# Patient Record
Sex: Female | Born: 1970 | Race: Black or African American | Hispanic: No | Marital: Married | Smoking: Never smoker
Health system: Southern US, Community
[De-identification: ages and names within clinical notes are randomized; demographics above are authoritative.]

## PROBLEM LIST (undated history)

## (undated) DIAGNOSIS — I10 Essential (primary) hypertension: Secondary | ICD-10-CM

## (undated) HISTORY — DX: Essential (primary) hypertension: I10

---

## 2003-12-04 ENCOUNTER — Other Ambulatory Visit: Admission: RE | Admit: 2003-12-04 | Discharge: 2003-12-04 | Payer: Self-pay | Admitting: Obstetrics and Gynecology

## 2004-08-01 ENCOUNTER — Encounter: Admission: RE | Admit: 2004-08-01 | Discharge: 2004-08-01 | Payer: Self-pay | Admitting: Obstetrics and Gynecology

## 2004-08-02 ENCOUNTER — Inpatient Hospital Stay (HOSPITAL_COMMUNITY): Admission: AD | Admit: 2004-08-02 | Discharge: 2004-08-04 | Payer: Self-pay | Admitting: Obstetrics and Gynecology

## 2004-09-14 ENCOUNTER — Other Ambulatory Visit: Admission: RE | Admit: 2004-09-14 | Discharge: 2004-09-14 | Payer: Self-pay | Admitting: Obstetrics and Gynecology

## 2008-02-23 ENCOUNTER — Inpatient Hospital Stay (HOSPITAL_COMMUNITY): Admission: AD | Admit: 2008-02-23 | Discharge: 2008-02-25 | Payer: Self-pay | Admitting: Obstetrics & Gynecology

## 2009-06-14 ENCOUNTER — Inpatient Hospital Stay (HOSPITAL_COMMUNITY): Admission: AD | Admit: 2009-06-14 | Discharge: 2009-06-14 | Payer: Self-pay | Admitting: Obstetrics

## 2009-09-07 ENCOUNTER — Inpatient Hospital Stay (HOSPITAL_COMMUNITY): Admission: RE | Admit: 2009-09-07 | Discharge: 2009-09-09 | Payer: Self-pay | Admitting: Obstetrics

## 2009-11-15 ENCOUNTER — Ambulatory Visit (HOSPITAL_COMMUNITY): Admission: RE | Admit: 2009-11-15 | Discharge: 2009-11-15 | Payer: Self-pay | Admitting: Obstetrics & Gynecology

## 2011-03-22 LAB — CBC
Platelets: 253 10*3/uL (ref 150–400)
RBC: 4.26 MIL/uL (ref 3.87–5.11)
WBC: 8.6 10*3/uL (ref 4.0–10.5)

## 2011-03-22 LAB — PREGNANCY, URINE: Preg Test, Ur: NEGATIVE

## 2011-03-24 LAB — CBC
HCT: 33.2 % — ABNORMAL LOW (ref 36.0–46.0)
HCT: 35.8 % — ABNORMAL LOW (ref 36.0–46.0)
Hemoglobin: 11.3 g/dL — ABNORMAL LOW (ref 12.0–15.0)
Hemoglobin: 12 g/dL (ref 12.0–15.0)
MCHC: 33.9 g/dL (ref 30.0–36.0)
MCV: 91 fL (ref 78.0–100.0)
MCV: 92.6 fL (ref 78.0–100.0)
RBC: 3.59 MIL/uL — ABNORMAL LOW (ref 3.87–5.11)
RBC: 3.94 MIL/uL (ref 3.87–5.11)
RDW: 14.9 % (ref 11.5–15.5)
WBC: 8.6 10*3/uL (ref 4.0–10.5)

## 2011-03-24 LAB — RH IMMUNE GLOB WKUP(>/=20WKS)(NOT WOMEN'S HOSP): Fetal Screen: NEGATIVE

## 2011-03-27 LAB — RH IMMUNE GLOBULIN WORKUP (NOT WOMEN'S HOSP)
ABO/RH(D): O NEG
Antibody Screen: NEGATIVE

## 2011-05-05 NOTE — Discharge Summary (Signed)
Leslie Duke               ACCOUNT NO.:  [id number]   MEDICAL RECORD NO.:  [id number]          PATIENT TYPE:  INP   LOCATION:  9135                          FACILITY:  [facility]   PHYSICIAN:  James A. Ashley Royalty, M.D.DATE OF BIRTH:  03-02-1971   DATE OF ADMISSION:  08/02/2004  DATE OF DISCHARGE:  08/04/2004                                 DISCHARGE SUMMARY   DISCHARGE DIAGNOSES:  1.  Intrauterine pregnancy at 41+ weeks gestation.  2.  O negative blood type.  3.  Group B strep positive.  4.  Labor.  5.  Term birth __________ vertex.   PROCEDURE:  OB delivery with episiotomy, episiorrhaphy.   CONSULTATIONS:  None.   DISCHARGE MEDICATIONS:  1.  Percocet.  2.  Motrin.   HISTORY OF PRESENT ILLNESS:  This is a 40 year old gravida 2, para 0, AB 1  with the aforementioned risk factors.  The patient was admitted for  induction secondary to postdates.  For the remainder of the history and  physical, please see the chart.   HOSPITAL COURSE:  The patient was admitted to [facility]  [place].  Admission laboratory studies were drawn.  She went on to labor  and deliver on August 02, 2004.  The infant was a 6 pound 12 ounce female  with Apgars of 8 at one minute and 9 at five minutes and sent to the newborn  nursery.  Delivery was accomplished by Dr. Sylvester Harder over a second-  degree midline episiotomy which was repaired without difficulty.  After  delivery, the umbilical cord evulsed, necessitating manual removal of the  placenta.  The placenta and membranes were felt to have been removed totally  and submitted to pathology for histologic studies.  Intrapartum cefotetan  was given prophylactically.  The patient's postpartum course was benign.  She was discharged on the second postpartum day afebrile and in satisfactory  condition.      JAM/MEDQ  D:  09/14/2004  T:  09/15/2004  Job:  [id number]

## 2011-09-11 LAB — CBC
HCT: 31.1 — ABNORMAL LOW
HCT: 31.9 — ABNORMAL LOW
MCHC: 34.8
MCV: 87.1
Platelets: 215
Platelets: 223
RBC: 3.53 — ABNORMAL LOW
WBC: 12.9 — ABNORMAL HIGH
WBC: 8.4

## 2011-09-11 LAB — RH IMMUNE GLOB WKUP(>/=20WKS)(NOT WOMEN'S HOSP)

## 2020-10-27 ENCOUNTER — Ambulatory Visit: Payer: 59 | Admitting: Registered Nurse

## 2020-10-27 ENCOUNTER — Other Ambulatory Visit: Payer: Self-pay

## 2020-10-27 ENCOUNTER — Encounter: Payer: Self-pay | Admitting: Registered Nurse

## 2020-10-27 VITALS — BP 138/84 | HR 75 | Temp 98.1°F | Resp 17 | Ht 65.0 in | Wt 282.2 lb

## 2020-10-27 DIAGNOSIS — Z0001 Encounter for general adult medical examination with abnormal findings: Secondary | ICD-10-CM | POA: Diagnosis not present

## 2020-10-27 DIAGNOSIS — Z13228 Encounter for screening for other metabolic disorders: Secondary | ICD-10-CM

## 2020-10-27 DIAGNOSIS — Z1211 Encounter for screening for malignant neoplasm of colon: Secondary | ICD-10-CM

## 2020-10-27 DIAGNOSIS — Z1329 Encounter for screening for other suspected endocrine disorder: Secondary | ICD-10-CM | POA: Diagnosis not present

## 2020-10-27 DIAGNOSIS — Z13220 Encounter for screening for lipoid disorders: Secondary | ICD-10-CM

## 2020-10-27 DIAGNOSIS — Z130 Encounter for screening for diseases of the blood and blood-forming organs and certain disorders involving the immune mechanism: Secondary | ICD-10-CM

## 2020-10-27 DIAGNOSIS — K219 Gastro-esophageal reflux disease without esophagitis: Secondary | ICD-10-CM

## 2020-10-27 DIAGNOSIS — Z1231 Encounter for screening mammogram for malignant neoplasm of breast: Secondary | ICD-10-CM

## 2020-10-27 DIAGNOSIS — Z7689 Persons encountering health services in other specified circumstances: Secondary | ICD-10-CM

## 2020-10-27 DIAGNOSIS — Z0000 Encounter for general adult medical examination without abnormal findings: Secondary | ICD-10-CM

## 2020-10-27 LAB — CBC WITH DIFFERENTIAL/PLATELET
Basos: 1 %
Hemoglobin: 13.1 g/dL (ref 11.1–15.9)
Immature Granulocytes: 0 %
Lymphs: 29 %
Neutrophils Absolute: 3.1 10*3/uL (ref 1.4–7.0)
Neutrophils: 58 %

## 2020-10-27 LAB — TSH: TSH: 1.93 u[IU]/mL (ref 0.450–4.500)

## 2020-10-27 LAB — COMPREHENSIVE METABOLIC PANEL
BUN: 9 mg/dL (ref 6–24)
CO2: 23 mmol/L (ref 20–29)
Potassium: 4.9 mmol/L (ref 3.5–5.2)
Sodium: 139 mmol/L (ref 134–144)
Total Protein: 7.7 g/dL (ref 6.0–8.5)

## 2020-10-27 LAB — HEMOGLOBIN A1C

## 2020-10-27 LAB — LIPID PANEL: HDL: 47 mg/dL (ref >39)

## 2020-10-27 MED ORDER — PANTOPRAZOLE SODIUM 40 MG PO TBEC: 40.0000 mg | DELAYED_RELEASE_TABLET | Freq: Every day | ORAL | 3 refills | Status: DC

## 2020-10-27 NOTE — Patient Instructions (Signed)
° ° ° °  If you have lab work done today you will be contacted with your lab results within the next 2 weeks.  If you have not heard from us then please contact us. The fastest way to get your results is to register for My Chart. ° ° °IF you received an x-ray today, you will receive an invoice from [facility]. Please contact [facility] at [phone] with questions or concerns regarding your invoice.  ° °IF you received labwork today, you will receive an invoice from [facility]. Please contact [facility] at [phone] with questions or concerns regarding your invoice.  ° °Our billing staff will not be able to assist you with questions regarding bills from these companies. ° °You will be contacted with the lab results as soon as they are available. The fastest way to get your results is to activate your My Chart account. Instructions are located on the last page of this paperwork. If you have not heard from us regarding the results in 2 weeks, please contact this office. °  ° ° ° °

## 2020-10-27 NOTE — Progress Notes (Signed)
Established Patient Office Visit  Subjective:  Patient ID: Leslie Duke, female    DOB: 1971/06/07  Age: 49 y.o. MRN: [id number]  CC:  Chief Complaint  Patient presents with   New Patient (Initial Visit)    Patient states she is here to establish care and for a CPE. Patient would like something for acid reflux    HPI Leslie Duke presents for visit to establish care and CPE  C/o GERD. Ongoing for some time. No melena or brbpr. No emesis. Tries to avoid trigger foods. Has not tried treatment.  Otherwise feels well.   Ordering cologuard and mammography.  Collecting labs today.  History reviewed. No pertinent past medical history.  History reviewed. No pertinent surgical history.  Family History  Problem Relation Age of Onset   Diabetes Mother    Hypertension Mother    Congestive Heart Failure Mother    Healthy Daughter    Healthy Son     Social History   Socioeconomic History   Marital status: Married    Spouse name: Not on file   Number of children: 3   Years of education: Not on file   Highest education level: Not on file  Occupational History   Not on file  Tobacco Use   Smoking status: Never Smoker   Smokeless tobacco: Never Used  Vaping Use   Vaping Use: Never used  Substance and Sexual Activity   Alcohol use: Not Currently   Drug use: Never   Sexual activity: Not Currently  Other Topics Concern   Not on file  Social History Narrative   Not on file   Social Determinants of Health   Financial Resource Strain:    Difficulty of Paying Living Expenses: Not on file  Food Insecurity:    Worried About [place] in the Last Year: Not on file   Ran Out of Food in the Last Year: Not on file  Transportation Needs:    Lack of Transportation (Medical): Not on file   Lack of Transportation (Non-Medical): Not on file  Physical Activity:    Days of Exercise per Week: Not on file   Minutes of Exercise per Session: Not  on file  Stress:    Feeling of Stress : Not on file  Social Connections:    Frequency of Communication with Friends and Family: Not on file   Frequency of Social Gatherings with Friends and Family: Not on file   Attends Religious Services: Not on file   Active Member of Clubs or Organizations: Not on file   Attends Archivist Meetings: Not on file   Marital Status: Not on file  Intimate Partner Violence:    Fear of Current or Ex-Partner: Not on file   Emotionally Abused: Not on file   Physically Abused: Not on file   Sexually Abused: Not on file    No outpatient medications prior to visit.   No facility-administered medications prior to visit.    No Known Allergies  ROS Review of Systems  Constitutional: Negative.   HENT: Negative.   Eyes: Negative.   Respiratory: Negative.   Cardiovascular: Negative.   Gastrointestinal: Negative.   Genitourinary: Negative.   Musculoskeletal: Negative.   Skin: Negative.   Neurological: Negative.   Psychiatric/Behavioral: Negative.       Objective:    Physical Exam Vitals and nursing note reviewed.  Constitutional:      General: She is not in acute distress.    Appearance: Normal appearance.  She is normal weight. She is not ill-appearing, toxic-appearing or diaphoretic.  HENT:     Head: Normocephalic and atraumatic.     Right Ear: Tympanic membrane, ear canal and external ear normal. There is no impacted cerumen.     Left Ear: Tympanic membrane, ear canal and external ear normal. There is no impacted cerumen.     Nose: Nose normal. No congestion or rhinorrhea.     Mouth/Throat:     Mouth: Mucous membranes are moist.     Pharynx: Oropharynx is clear. No oropharyngeal exudate or posterior oropharyngeal erythema.  Eyes:     General: No scleral icterus.       Right eye: No discharge.        Left eye: No discharge.     Extraocular Movements: Extraocular movements intact.     Conjunctiva/sclera: Conjunctivae  normal.     Pupils: Pupils are equal, round, and reactive to light.  Cardiovascular:     Rate and Rhythm: Normal rate and regular rhythm.     Pulses: Normal pulses.     Heart sounds: Normal heart sounds. No murmur heard.  No friction rub. No gallop.   Pulmonary:     Effort: Pulmonary effort is normal. No respiratory distress.     Breath sounds: Normal breath sounds. No stridor. No wheezing, rhonchi or rales.  Chest:     Chest wall: No tenderness.  Abdominal:     General: Abdomen is flat. Bowel sounds are normal. There is no distension.     Palpations: Abdomen is soft. There is no mass.     Tenderness: There is no abdominal tenderness. There is no right CVA tenderness, left CVA tenderness, guarding or rebound.     Hernia: No hernia is present.  Musculoskeletal:        General: No swelling, tenderness, deformity or signs of injury. Normal range of motion.     Right lower leg: No edema.     Left lower leg: No edema.  Skin:    General: Skin is warm and dry.     Capillary Refill: Capillary refill takes less than 2 seconds.     Coloration: Skin is not jaundiced or pale.     Findings: No bruising, erythema, lesion or rash.  Neurological:     General: No focal deficit present.     Mental Status: She is alert and oriented to person, place, and time. Mental status is at baseline.     Cranial Nerves: No cranial nerve deficit.     Sensory: No sensory deficit.     Motor: No weakness.     Coordination: Coordination normal.     Gait: Gait normal.     Deep Tendon Reflexes: Reflexes normal.  Psychiatric:        Mood and Affect: Mood normal.        Behavior: Behavior normal.        Thought Content: Thought content normal.        Judgment: Judgment normal.     BP 138/84    Pulse 75    Temp 98.1 F (36.7 C) (Temporal)    Resp 17    Ht _0  (1.651 m)    Wt 282 lb 3.2 oz (128 kg)    LMP 10/01/2020    SpO2 97%    BMI 46.96 kg/m  Wt Readings from Last 3 Encounters:  10/27/20 282 lb 3.2 oz  (128 kg)     Health Maintenance Due  Topic Date Due  COVID-19 Vaccine (1) Never done    There are no preventive care reminders to display for this patient.  No results found for: TSH Lab Results  Component Value Date   WBC 8.6 11/15/2009   HGB 12.7 11/15/2009   HCT 38.1 11/15/2009   MCV 89.5 11/15/2009   PLT 253 11/15/2009   No results found for: NA, K, CHLORIDE, CO2, GLUCOSE, BUN, CREATININE, BILITOT, ALKPHOS, AST, ALT, PROT, ALBUMIN, CALCIUM, ANIONGAP, EGFR, GFR No results found for: CHOL No results found for: HDL No results found for: LDLCALC No results found for: TRIG No results found for: CHOLHDL No results found for: HGBA1C    Assessment & Plan:   Problem List Items Addressed This Visit    None    Visit Diagnoses    Annual physical exam    -  Primary   Screening for endocrine, metabolic and immunity disorder       Relevant Orders   TSH   CBC with Differential   Comprehensive metabolic panel   Hemoglobin A1c   Lipid screening       Relevant Orders   Lipid panel   Colon cancer screening       Relevant Orders   Cologuard   Screening mammogram for breast cancer       Relevant Orders   MM Digital Screening   Encounter to establish care          No orders of the defined types were placed in this encounter.   Follow-up: No follow-ups on file.   PLAN  cologuard ordered  mammo screening  Labs collected. Will follow up as warranted  Histories reviewed with patient  Pantoprazole 34m PO qd for gerd  Exam unremarkable  Patient encouraged to call clinic with any questions, comments, or concerns.  RMaximiano Coss NP

## 2020-10-28 LAB — COMPREHENSIVE METABOLIC PANEL
ALT: 15 IU/L (ref 0–32)
AST: 15 IU/L (ref 0–40)
Albumin/Globulin Ratio: 1.1 — ABNORMAL LOW (ref 1.2–2.2)
Albumin: 4.1 g/dL (ref 3.8–4.8)
Alkaline Phosphatase: 103 IU/L (ref 44–121)
BUN/Creatinine Ratio: 11 (ref 9–23)
Bilirubin Total: 0.4 mg/dL (ref 0.0–1.2)
Calcium: 9.4 mg/dL (ref 8.7–10.2)
Chloride: 104 mmol/L (ref 96–106)
Creatinine, Ser: 0.85 mg/dL (ref 0.57–1.00)
GFR calc Af Amer: 94 mL/min/{1.73_m2} (ref >59)
GFR calc non Af Amer: 81 mL/min/{1.73_m2} (ref >59)
Globulin, Total: 3.6 g/dL (ref 1.5–4.5)
Glucose: 99 mg/dL (ref 65–99)

## 2020-10-28 LAB — CBC WITH DIFFERENTIAL/PLATELET
Basophils Absolute: 0.1 10*3/uL (ref 0.0–0.2)
EOS (ABSOLUTE): 0.2 10*3/uL (ref 0.0–0.4)
Eos: 3 %
Hematocrit: 40.4 % (ref 34.0–46.6)
Immature Grans (Abs): 0 10*3/uL (ref 0.0–0.1)
Lymphocytes Absolute: 1.6 10*3/uL (ref 0.7–3.1)
MCH: 27.7 pg (ref 26.6–33.0)
MCHC: 32.4 g/dL (ref 31.5–35.7)
MCV: 85 fL (ref 79–97)
Monocytes Absolute: 0.5 10*3/uL (ref 0.1–0.9)
Monocytes: 9 %
Platelets: 304 10*3/uL (ref 150–450)
RBC: 4.73 x10E6/uL (ref 3.77–5.28)
RDW: 13.4 % (ref 11.7–15.4)
WBC: 5.4 10*3/uL (ref 3.4–10.8)

## 2020-10-28 LAB — LIPID PANEL
Chol/HDL Ratio: 3.5 ratio (ref 0.0–4.4)
Cholesterol, Total: 166 mg/dL (ref 100–199)
LDL Chol Calc (NIH): 108 mg/dL — ABNORMAL HIGH (ref 0–99)
Triglycerides: 55 mg/dL (ref 0–149)
VLDL Cholesterol Cal: 11 mg/dL (ref 5–40)

## 2020-10-28 LAB — HEMOGLOBIN A1C: Hgb A1c MFr Bld: 5.9 % — ABNORMAL HIGH (ref 4.8–5.6)

## 2020-11-04 ENCOUNTER — Encounter: Payer: Self-pay | Admitting: Registered Nurse

## 2020-12-28 ENCOUNTER — Other Ambulatory Visit: Payer: Self-pay | Admitting: Registered Nurse

## 2020-12-28 DIAGNOSIS — Z1231 Encounter for screening mammogram for malignant neoplasm of breast: Secondary | ICD-10-CM

## 2020-12-29 ENCOUNTER — Other Ambulatory Visit: Payer: Self-pay

## 2020-12-29 ENCOUNTER — Ambulatory Visit
Admission: RE | Admit: 2020-12-29 | Discharge: 2020-12-29 | Disposition: A | Discharge status: Home / Self Care | Payer: 59 | Source: Ambulatory Visit | Attending: Registered Nurse | Admitting: Registered Nurse

## 2020-12-29 DIAGNOSIS — Z1231 Encounter for screening mammogram for malignant neoplasm of breast: Secondary | ICD-10-CM

## 2021-03-06 IMAGING — MG MM DIGITAL SCREENING BILAT W/ TOMO AND CAD
6 of 10 series · 6 of 30 positions shown · non-contrast
Comparison: None.

CLINICAL DATA: Screening.

EXAM:
DIGITAL SCREENING BILATERAL MAMMOGRAM WITH TOMO AND CAD

[L CC synth-2D (1 of 2)]
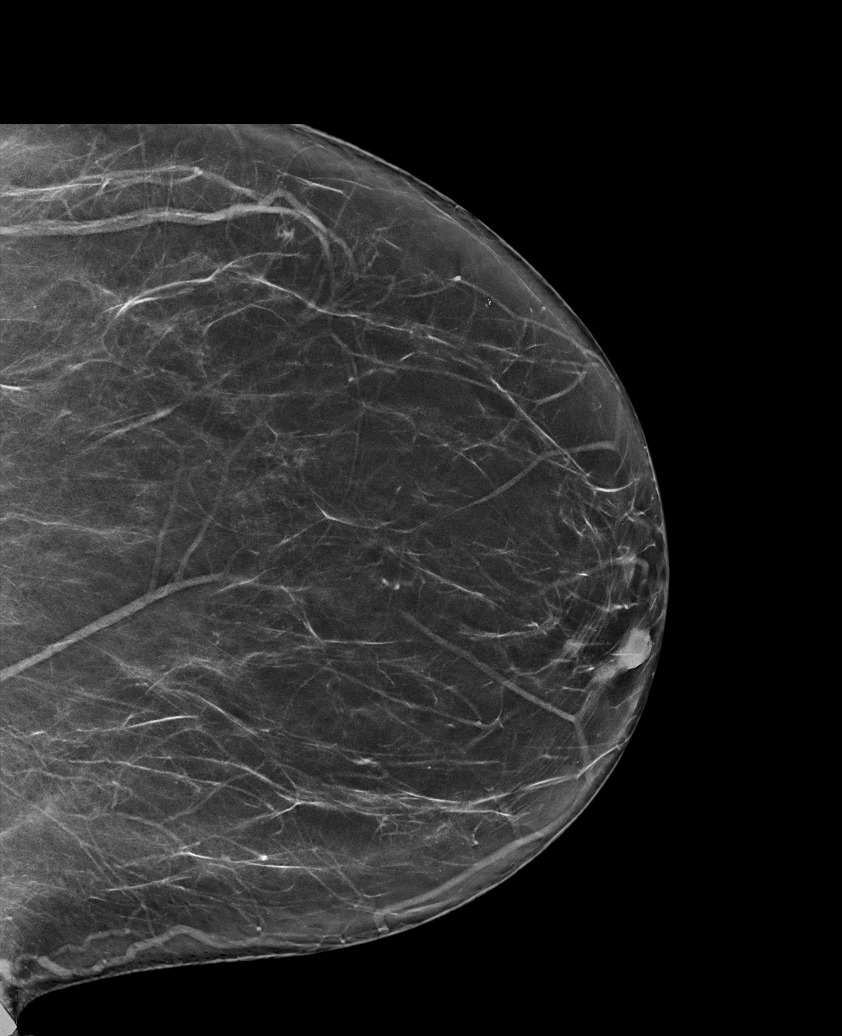

[L MLO synth-2D]
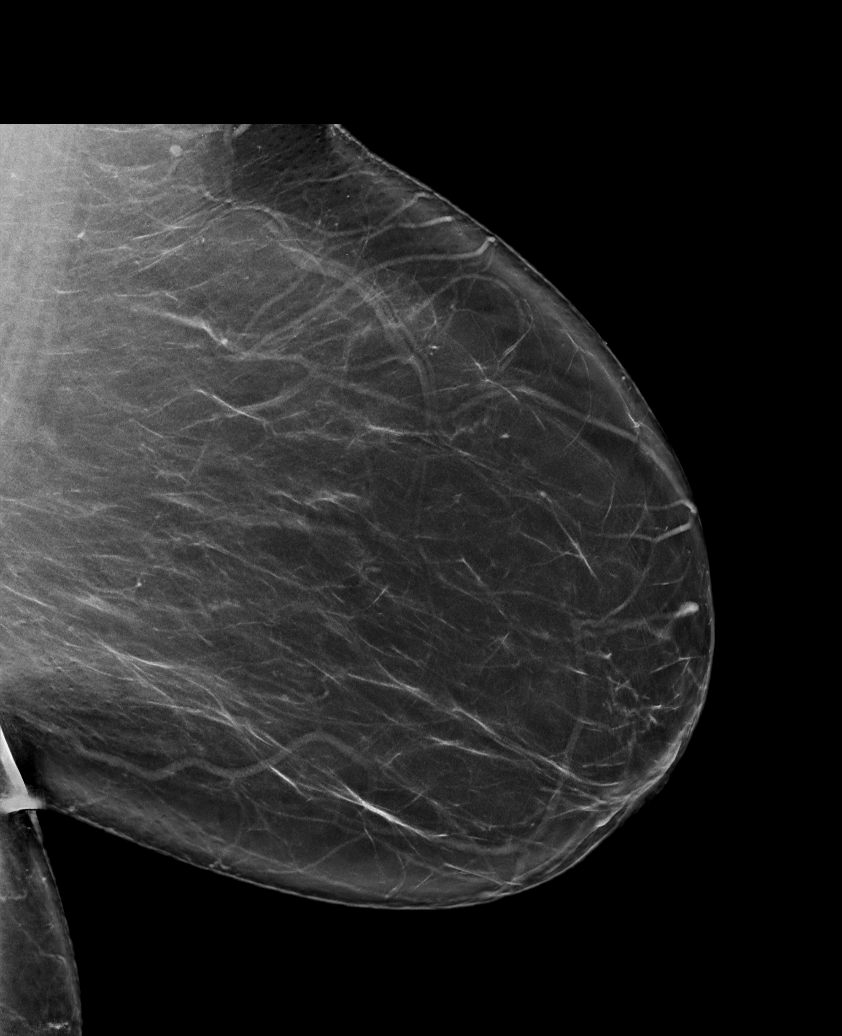

[L CC synth-2D (2 of 2)]
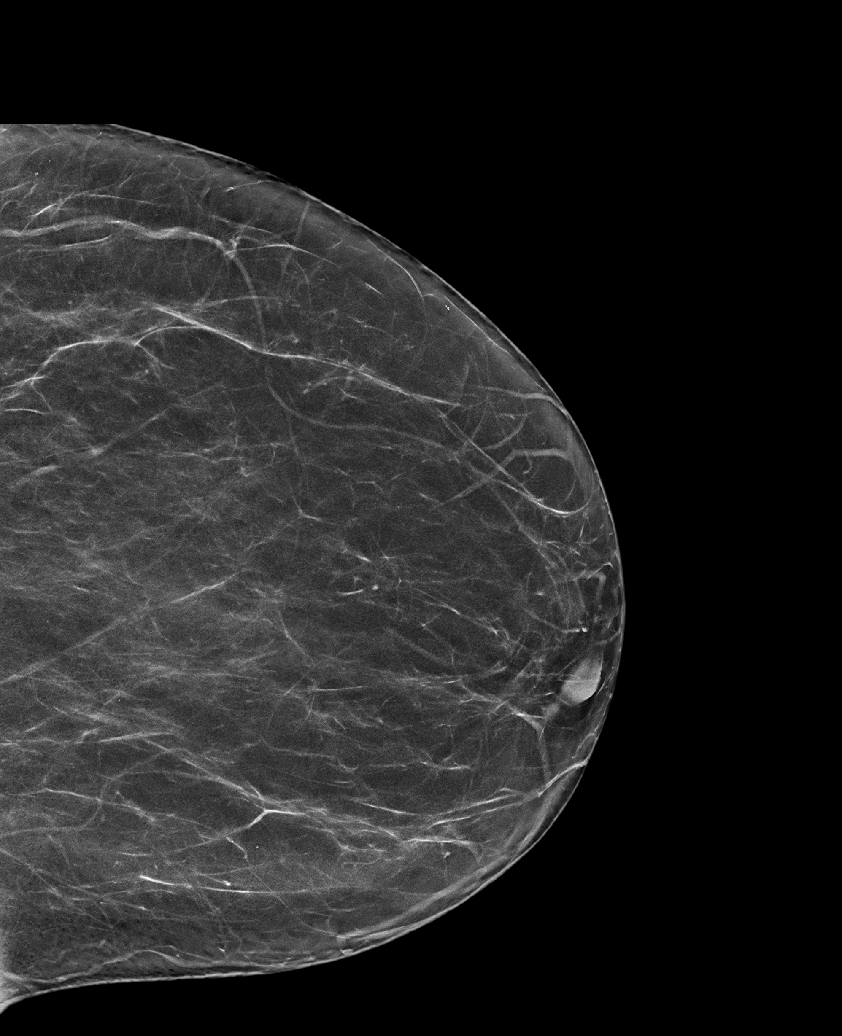

[R MLO synth-2D]
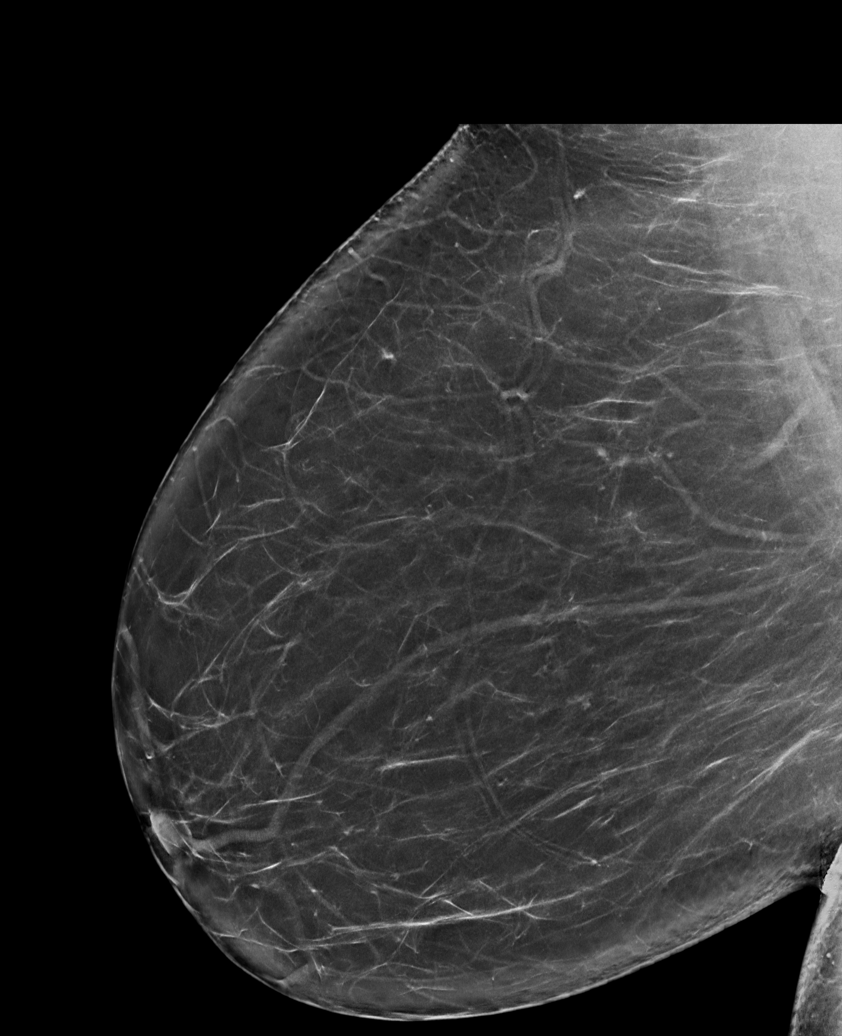

[R CC synth-2D]
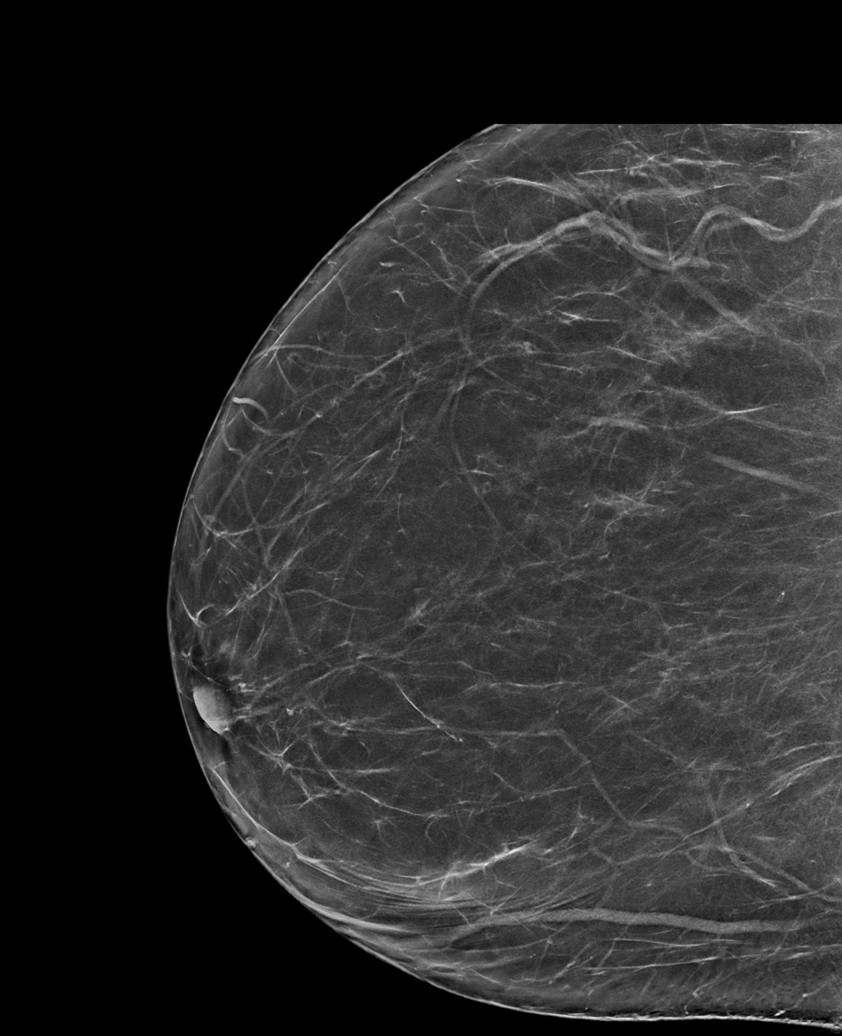

[R CC tomo · tomo slice 41/82.0]
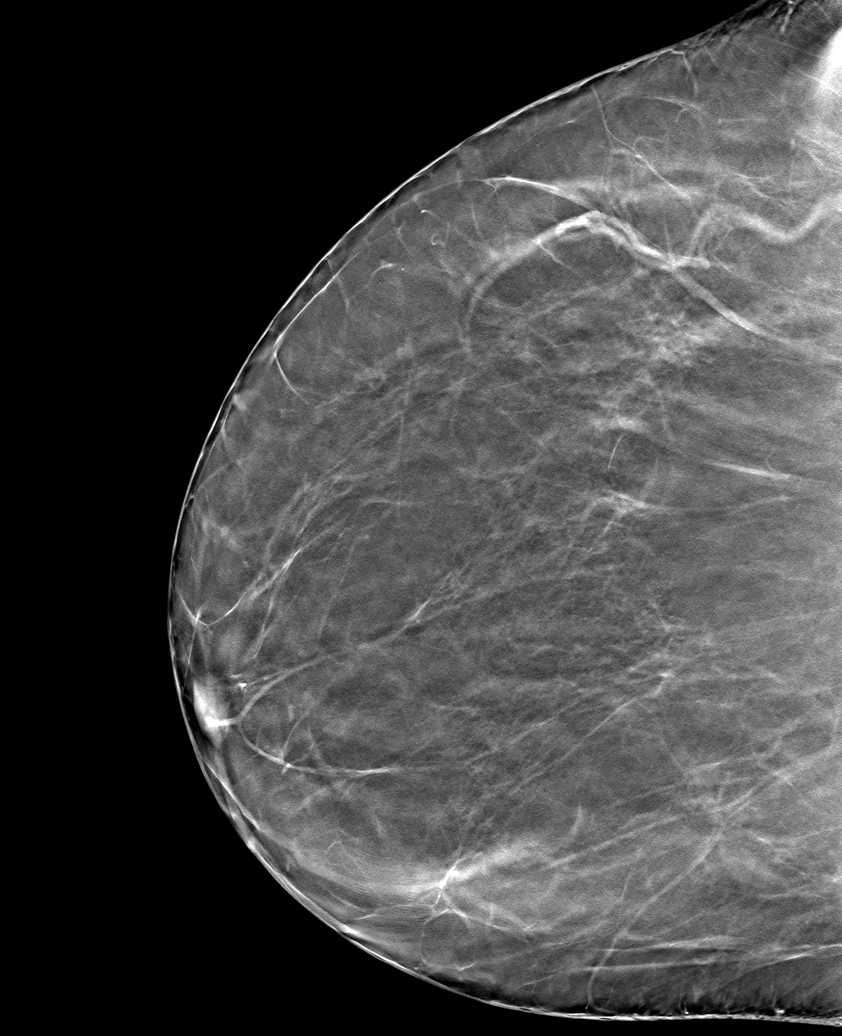

[6 of 30 positions shown; findings below may reference images not displayed]

ACR Breast Density Category b: There are scattered areas of
fibroglandular density.
FINDINGS: There are no findings suspicious for malignancy. Images were
processed with CAD.
IMPRESSION: No mammographic evidence of malignancy. A result letter of this
screening mammogram will be mailed directly to the patient.

RECOMMENDATION:
Screening mammogram in one year. (Code:Y5-G-EJ6)

BI-RADS CATEGORY  1: Negative.

## 2021-11-01 ENCOUNTER — Ambulatory Visit (INDEPENDENT_AMBULATORY_CARE_PROVIDER_SITE_OTHER): Payer: 59 | Admitting: Registered Nurse

## 2021-11-01 ENCOUNTER — Encounter: Payer: Self-pay | Admitting: Registered Nurse

## 2021-11-01 VITALS — BP 158/92 | HR 68 | Temp 98.2°F | Resp 16 | Ht 65.0 in | Wt 274.0 lb

## 2021-11-01 DIAGNOSIS — Z0000 Encounter for general adult medical examination without abnormal findings: Secondary | ICD-10-CM

## 2021-11-01 DIAGNOSIS — Z23 Encounter for immunization: Secondary | ICD-10-CM

## 2021-11-01 DIAGNOSIS — R5383 Other fatigue: Secondary | ICD-10-CM | POA: Diagnosis not present

## 2021-11-01 DIAGNOSIS — Z130 Encounter for screening for diseases of the blood and blood-forming organs and certain disorders involving the immune mechanism: Secondary | ICD-10-CM

## 2021-11-01 DIAGNOSIS — Z5320 Procedure and treatment not carried out because of patient's decision for unspecified reasons: Secondary | ICD-10-CM

## 2021-11-01 DIAGNOSIS — Z13228 Encounter for screening for other metabolic disorders: Secondary | ICD-10-CM

## 2021-11-01 DIAGNOSIS — K219 Gastro-esophageal reflux disease without esophagitis: Secondary | ICD-10-CM

## 2021-11-01 DIAGNOSIS — Z13220 Encounter for screening for lipoid disorders: Secondary | ICD-10-CM | POA: Diagnosis not present

## 2021-11-01 DIAGNOSIS — Z1329 Encounter for screening for other suspected endocrine disorder: Secondary | ICD-10-CM | POA: Diagnosis not present

## 2021-11-01 DIAGNOSIS — Z1159 Encounter for screening for other viral diseases: Secondary | ICD-10-CM

## 2021-11-01 DIAGNOSIS — I10 Essential (primary) hypertension: Secondary | ICD-10-CM

## 2021-11-01 DIAGNOSIS — Z1211 Encounter for screening for malignant neoplasm of colon: Secondary | ICD-10-CM

## 2021-11-01 LAB — CBC WITH DIFFERENTIAL/PLATELET
Basophils Absolute: 0.1 10*3/uL (ref 0.0–0.1)
Basophils Relative: 1 % (ref 0.0–3.0)
Eosinophils Absolute: 0.2 10*3/uL (ref 0.0–0.7)
Eosinophils Relative: 2.9 % (ref 0.0–5.0)
HCT: 40.7 % (ref 36.0–46.0)
Hemoglobin: 13.2 g/dL (ref 12.0–15.0)
Lymphocytes Relative: 34.2 % (ref 12.0–46.0)
Lymphs Abs: 1.8 10*3/uL (ref 0.7–4.0)
MCHC: 32.4 g/dL (ref 30.0–36.0)
MCV: 87 fl (ref 78.0–100.0)
Monocytes Absolute: 0.4 10*3/uL (ref 0.1–1.0)
Monocytes Relative: 8.1 % (ref 3.0–12.0)
Neutro Abs: 2.8 10*3/uL (ref 1.4–7.7)
Neutrophils Relative %: 53.8 % (ref 43.0–77.0)
Platelets: 261 10*3/uL (ref 150.0–400.0)
RBC: 4.68 Mil/uL (ref 3.87–5.11)
RDW: 14.8 % (ref 11.5–15.5)
WBC: 5.3 10*3/uL (ref 4.0–10.5)

## 2021-11-01 LAB — HEMOGLOBIN A1C: Hgb A1c MFr Bld: 6.3 % (ref 4.6–6.5)

## 2021-11-01 LAB — COMPREHENSIVE METABOLIC PANEL
ALT: 13 U/L (ref 0–35)
AST: 16 U/L (ref 0–37)
Albumin: 4 g/dL (ref 3.5–5.2)
Alkaline Phosphatase: 88 U/L (ref 39–117)
BUN: 11 mg/dL (ref 6–23)
CO2: 28 mEq/L (ref 19–32)
Calcium: 9 mg/dL (ref 8.4–10.5)
Chloride: 102 mEq/L (ref 96–112)
Creatinine, Ser: 0.86 mg/dL (ref 0.40–1.20)
GFR: 79.12 mL/min (ref >60.00)
Glucose, Bld: 83 mg/dL (ref 70–99)
Potassium: 4 mEq/L (ref 3.5–5.1)
Sodium: 138 mEq/L (ref 135–145)
Total Bilirubin: 0.4 mg/dL (ref 0.2–1.2)
Total Protein: 7.8 g/dL (ref 6.0–8.3)

## 2021-11-01 LAB — LIPID PANEL
Cholesterol: 169 mg/dL (ref 0–200)
HDL: 51.3 mg/dL (ref >39.00)
LDL Cholesterol: 107 mg/dL — ABNORMAL HIGH (ref 0–99)
NonHDL: 118.13
Total CHOL/HDL Ratio: 3
Triglycerides: 55 mg/dL (ref 0.0–149.0)
VLDL: 11 mg/dL (ref 0.0–40.0)

## 2021-11-01 LAB — VITAMIN D 25 HYDROXY (VIT D DEFICIENCY, FRACTURES): VITD: 10.62 ng/mL — ABNORMAL LOW (ref 30.00–100.00)

## 2021-11-01 LAB — TSH: TSH: 1.99 u[IU]/mL (ref 0.35–5.50)

## 2021-11-01 MED ORDER — AMLODIPINE BESYLATE 5 MG PO TABS: 5.0000 mg | ORAL_TABLET | Freq: Every day | ORAL | 1 refills | Status: DC

## 2021-11-01 MED ORDER — PANTOPRAZOLE SODIUM 40 MG PO TBEC: 40.0000 mg | DELAYED_RELEASE_TABLET | Freq: Every day | ORAL | 3 refills | Status: DC

## 2021-11-01 NOTE — Patient Instructions (Addendum)
Ms. Leslie Duke to see you  No concerns on exam  I'll call if labs are worrisome  I have sent pantoprazole to your pharmacy  I have referred you to gynecology for a pap smear.  Cologuard screening for colon cancer sent . Please return this within 2-3 weeks of receiving it.  Tetanus shot given today.   Start amlodipine 5mg  daily. Come back for a BP check in 2-4 weeks  Thank you  Rich      If you have lab work done today you will be contacted with your lab results within the next 2 weeks.  If you have not heard from then please contact us. The fastest way to get your results is to register for My Chart.   IF you received an x-ray today, you will receive an invoice from [facility]. Please contact [facility] at [phone] with questions or concerns regarding your invoice.   IF you received labwork today, you will receive an invoice from [facility]. Please contact [facility] at [phone] with questions or concerns regarding your invoice.   Our billing staff will not be able to assist you with questions regarding bills from these companies.  You will be contacted with the lab results as soon as they are available. The fastest way to get your results is to activate your My Chart account. Instructions are located on the last page of this paperwork. If you have not heard from [phone] regarding the results in 2 weeks, please contact this office.

## 2021-11-01 NOTE — Progress Notes (Signed)
Established Patient Office Visit  Subjective:  Patient ID: Leslie Duke, female    DOB: 10-14-1971  Age: 50 y.o. MRN: [id number]  CC:  Chief Complaint  Patient presents with   Annual Exam    Patient states she is here for an CPE. Pt has no other concerns    HPI Leslie Duke presents for CPE  No acute concerns Histories reviewed and updated with patient.   Due for pap - refer to gynecology Due for tdap - will give today. Due for HIV and Hep C screening - will order today.  Does note some mild fatigue Ongoing No clear pattern Endorses good sleep No change to diet or exercise.   History reviewed. No pertinent past medical history.  History reviewed. No pertinent surgical history.  Family History  Problem Relation Age of Onset   Diabetes Mother    Hypertension Mother    Congestive Heart Failure Mother    Healthy Daughter    Healthy Son     Social History   Socioeconomic History   Marital status: Married    Spouse name: Not on file   Number of children: 3   Years of education: Not on file   Highest education level: Not on file  Occupational History   Not on file  Tobacco Use   Smoking status: Never   Smokeless tobacco: Never  Vaping Use   Vaping Use: Never used  Substance and Sexual Activity   Alcohol use: Not Currently   Drug use: Never   Sexual activity: Not Currently  Other Topics Concern   Not on file  Social History Narrative   Not on file   Social Determinants of Health   Financial Resource Strain: Not on file  Food Insecurity: Not on file  Transportation Needs: Not on file  Physical Activity: Not on file  Stress: Not on file  Social Connections: Not on file  Intimate Partner Violence: Not on file    Outpatient Medications Prior to Visit  Medication Sig Dispense Refill   pantoprazole (PROTONIX) 40 MG tablet Take 1 tablet (40 mg total) by mouth daily. 30 tablet 3   No facility-administered medications prior to visit.    Not on  File  ROS Review of Systems  Constitutional: Negative.   HENT: Negative.    Eyes: Negative.   Respiratory: Negative.    Cardiovascular: Negative.   Gastrointestinal: Negative.   Genitourinary: Negative.   Musculoskeletal: Negative.   Skin: Negative.   Neurological: Negative.   Psychiatric/Behavioral: Negative.    All other systems reviewed and are negative.    Objective:    Physical Exam Vitals and nursing note reviewed.  Constitutional:      General: She is not in acute distress.    Appearance: Normal appearance. She is normal weight. She is not ill-appearing, toxic-appearing or diaphoretic.  HENT:     Head: Normocephalic and atraumatic.     Right Ear: Tympanic membrane, ear canal and external ear normal. There is no impacted cerumen.     Left Ear: Tympanic membrane, ear canal and external ear normal. There is no impacted cerumen.     Nose: Nose normal. No congestion or rhinorrhea.     Mouth/Throat:     Mouth: Mucous membranes are moist.     Pharynx: Oropharynx is clear. No oropharyngeal exudate or posterior oropharyngeal erythema.  Eyes:     General: No scleral icterus.       Right eye: No discharge.  Left eye: No discharge.     Extraocular Movements: Extraocular movements intact.     Conjunctiva/sclera: Conjunctivae normal.     Pupils: Pupils are equal, round, and reactive to light.  Cardiovascular:     Rate and Rhythm: Normal rate and regular rhythm.     Pulses: Normal pulses.     Heart sounds: Normal heart sounds. No murmur heard.   No friction rub. No gallop.  Pulmonary:     Effort: Pulmonary effort is normal. No respiratory distress.     Breath sounds: Normal breath sounds. No stridor. No wheezing, rhonchi or rales.  Chest:     Chest wall: No tenderness.  Abdominal:     General: Abdomen is flat. Bowel sounds are normal. There is no distension.     Palpations: Abdomen is soft. There is no mass.     Tenderness: There is no abdominal tenderness. There is  no right CVA tenderness, left CVA tenderness, guarding or rebound.     Hernia: No hernia is present.  Musculoskeletal:        General: No swelling, tenderness, deformity or signs of injury. Normal range of motion.     Cervical back: Normal range of motion and neck supple.     Right lower leg: No edema.     Left lower leg: No edema.  Skin:    General: Skin is warm and dry.     Capillary Refill: Capillary refill takes less than 2 seconds.     Coloration: Skin is not jaundiced or pale.     Findings: No bruising, erythema, lesion or rash.  Neurological:     General: No focal deficit present.     Mental Status: She is alert and oriented to person, place, and time. Mental status is at baseline.     Cranial Nerves: No cranial nerve deficit.     Sensory: No sensory deficit.     Motor: No weakness.     Coordination: Coordination normal.     Gait: Gait normal.     Deep Tendon Reflexes: Reflexes normal.  Psychiatric:        Mood and Affect: Mood normal.        Behavior: Behavior normal.        Thought Content: Thought content normal.        Judgment: Judgment normal.    BP (!) 158/92   Pulse 68   Temp 98.2 F (36.8 C) (Temporal)   Resp 16   Ht 5\' 5"  (1.651 m)   Wt 274 lb (124.3 kg)   SpO2 99%   BMI 45.60 kg/m  Wt Readings from Last 3 Encounters:  11/01/21 274 lb (124.3 kg)  10/27/20 282 lb 3.2 oz (128 kg)     Health Maintenance Due  Topic Date Due   COVID-19 Vaccine (1) Never done   HIV Screening  Never done   Hepatitis C Screening  Never done   TETANUS/TDAP  Never done   PAP SMEAR-Modifier  Never done   COLONOSCOPY (Pts 45-58yrs Insurance coverage will need to be confirmed)  Never done   INFLUENZA VACCINE  Never done    There are no preventive care reminders to display for this patient.  Lab Results  Component Value Date   TSH 1.930 10/27/2020   Lab Results  Component Value Date   WBC 5.4 10/27/2020   HGB 13.1 10/27/2020   HCT 40.4 10/27/2020   MCV 85  10/27/2020   PLT 304 10/27/2020   Lab Results  Component  Value Date   NA 139 10/27/2020   K 4.9 10/27/2020   CO2 23 10/27/2020   GLUCOSE 99 10/27/2020   BUN 9 10/27/2020   CREATININE 0.85 10/27/2020   BILITOT 0.4 10/27/2020   ALKPHOS 103 10/27/2020   AST 15 10/27/2020   ALT 15 10/27/2020   PROT 7.7 10/27/2020   ALBUMIN 4.1 10/27/2020   CALCIUM 9.4 10/27/2020   Lab Results  Component Value Date   CHOL 166 10/27/2020   Lab Results  Component Value Date   HDL 47 10/27/2020   Lab Results  Component Value Date   LDLCALC 108 (H) 10/27/2020   Lab Results  Component Value Date   TRIG 55 10/27/2020   Lab Results  Component Value Date   CHOLHDL 3.5 10/27/2020   Lab Results  Component Value Date   HGBA1C 5.9 (H) 10/27/2020      Assessment & Plan:   Problem List Items Addressed This Visit   None Visit Diagnoses     Annual physical exam    -  Primary   Screening for endocrine, metabolic and immunity disorder       Relevant Orders   CBC with Differential/Platelet   Hemoglobin A1c   TSH   Comprehensive metabolic panel   Lipid screening       Relevant Orders   Lipid panel   Need for diphtheria-tetanus-pertussis (Tdap) vaccine       Relevant Orders   Tdap vaccine greater than or equal to 7yo IM   Colon cancer screening       Relevant Orders   Cologuard   Encounter for screening for other viral diseases       Relevant Orders   Hepatitis C antibody   HIV antibody (with reflex)   GERD without esophagitis       Relevant Medications   pantoprazole (PROTONIX) 40 MG tablet   Papanicolaou smear declined       Relevant Orders   Ambulatory referral to Gynecology   Other fatigue       Relevant Orders   Vitamin D (25 hydroxy)   Primary hypertension       Relevant Medications   amLODipine (NORVASC) 5 MG tablet       Meds ordered this encounter  Medications   pantoprazole (PROTONIX) 40 MG tablet    Sig: Take 1 tablet (40 mg total) by mouth daily.     Dispense:  30 tablet    Refill:  3    Order Specific Question:   Supervising Provider    Answer:   Neva Seat, JEFFREY R [[id number]]   amLODipine (NORVASC) 5 MG tablet    Sig: Take 1 tablet (5 mg total) by mouth daily.    Dispense:  90 tablet    Refill:  1    Order Specific Question:   Supervising Provider    Answer:   Neva Seat, JEFFREY R [[id number]]    Follow-up: Return in about 4 weeks (around 11/29/2021) for nurse visit bp check, then 1 year for CPE.   PLAN Bp elevated. Start amlodipine 5mg  po qd. Return in 3-4 weeks for nurse visit bp check. Labs collected. Will follow up with the patient as warranted. Include vitamin d for fatigue. Tdap given Patient encouraged to call clinic with any questions, comments, or concerns.  , NP

## 2021-11-02 ENCOUNTER — Other Ambulatory Visit: Payer: Self-pay | Admitting: Registered Nurse

## 2021-11-02 DIAGNOSIS — E559 Vitamin D deficiency, unspecified: Secondary | ICD-10-CM | POA: Insufficient documentation

## 2021-11-02 LAB — HIV ANTIBODY (ROUTINE TESTING W REFLEX): HIV 1&2 Ab, 4th Generation: NONREACTIVE

## 2021-11-02 LAB — HEPATITIS C ANTIBODY
Hepatitis C Ab: NONREACTIVE
SIGNAL TO CUT-OFF: 0.05 (ref <1.00)

## 2021-11-02 MED ORDER — VITAMIN D (ERGOCALCIFEROL) 1.25 MG (50000 UNIT) PO CAPS: 50000.0000 [IU] | ORAL_CAPSULE | ORAL | 0 refills | Status: DC

## 2021-11-29 ENCOUNTER — Ambulatory Visit: Payer: 59 | Admitting: Registered Nurse

## 2021-11-29 DIAGNOSIS — I10 Essential (primary) hypertension: Secondary | ICD-10-CM

## 2021-11-29 DIAGNOSIS — K219 Gastro-esophageal reflux disease without esophagitis: Secondary | ICD-10-CM

## 2021-11-29 NOTE — Progress Notes (Signed)
Pt here for BP check 132/80, no concerns reports doing well. Did request change in pharmacy to [facility] no other requests today

## 2022-01-19 LAB — COLOGUARD: COLOGUARD: NEGATIVE

## 2022-07-04 ENCOUNTER — Ambulatory Visit (HOSPITAL_COMMUNITY)
Admission: EM | Admit: 2022-07-04 | Discharge: 2022-07-04 | Disposition: A | Discharge status: Home / Self Care | Payer: 59 | Attending: Internal Medicine | Admitting: Internal Medicine

## 2022-07-04 ENCOUNTER — Ambulatory Visit (INDEPENDENT_AMBULATORY_CARE_PROVIDER_SITE_OTHER): Payer: 59

## 2022-07-04 ENCOUNTER — Encounter (HOSPITAL_COMMUNITY): Payer: Self-pay | Admitting: Emergency Medicine

## 2022-07-04 DIAGNOSIS — I10 Essential (primary) hypertension: Secondary | ICD-10-CM | POA: Diagnosis not present

## 2022-07-04 DIAGNOSIS — S39012A Strain of muscle, fascia and tendon of lower back, initial encounter: Secondary | ICD-10-CM

## 2022-07-04 DIAGNOSIS — M545 Low back pain, unspecified: Secondary | ICD-10-CM | POA: Diagnosis not present

## 2022-07-04 MED ORDER — IBUPROFEN 800 MG PO TABS
800.0000 mg | ORAL_TABLET | Freq: Once | ORAL | Status: AC
  Administered 2022-07-04: 800 mg via ORAL

## 2022-07-04 MED ORDER — IBUPROFEN 800 MG PO TABS
ORAL_TABLET | ORAL | Status: AC
  Filled 2022-07-04: qty 1

## 2022-07-04 MED ORDER — BLOOD PRESSURE CUFF MISC: 0 refills | Status: DC

## 2022-07-04 MED ORDER — IBUPROFEN 800 MG PO TABS
800.0000 mg | ORAL_TABLET | Freq: Three times a day (TID) | ORAL | 0 refills | Status: DC
Start: 2022-07-04 — End: 2022-07-11

## 2022-07-04 MED ORDER — METHOCARBAMOL 500 MG PO TABS: 500.0000 mg | ORAL_TABLET | Freq: Two times a day (BID) | ORAL | 0 refills | Status: DC

## 2022-07-04 MED ORDER — AMLODIPINE BESYLATE 5 MG PO TABS: 5.0000 mg | ORAL_TABLET | Freq: Every day | ORAL | 2 refills | Status: AC
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
## 2022-07-11 ENCOUNTER — Other Ambulatory Visit: Payer: Self-pay

## 2022-07-11 ENCOUNTER — Encounter: Payer: Self-pay | Admitting: Registered Nurse

## 2022-07-11 ENCOUNTER — Ambulatory Visit (INDEPENDENT_AMBULATORY_CARE_PROVIDER_SITE_OTHER): Payer: 59 | Admitting: Registered Nurse

## 2022-07-11 DIAGNOSIS — M545 Low back pain, unspecified: Secondary | ICD-10-CM | POA: Diagnosis not present

## 2022-07-11 MED ORDER — DICLOFENAC SODIUM 75 MG PO TBEC: 75.0000 mg | DELAYED_RELEASE_TABLET | Freq: Two times a day (BID) | ORAL | 0 refills | Status: DC

## 2022-07-11 MED ORDER — CYCLOBENZAPRINE HCL 10 MG PO TABS: 10.0000 mg | ORAL_TABLET | Freq: Three times a day (TID) | ORAL | 0 refills | Status: DC | PRN

## 2022-07-11 MED ORDER — TRAMADOL HCL 50 MG PO TABS: 50.0000 mg | ORAL_TABLET | Freq: Three times a day (TID) | ORAL | 0 refills | Status: AC | PRN

## 2022-07-11 NOTE — Patient Instructions (Signed)
Ms. Leslie Duke to see you  Sorry that you are still in pain  Stop ibuprofen, stop methocarbamol.   Start diclofenac twice daily and flexeril three times daily as needed for pain  Ok to use tramadol sparingly for breakthrough pain  I will refer to physical therapy, they will call you  I recommend these providers: Jarold Motto, PA Jacquiline Doe, MD Edwina Barth, MD Letta Moynahan Early, NP Jiles Prows, DNP   Call if you need anything!  Thank you for letting me take part in your care,  Rich
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
## 2022-07-25 ENCOUNTER — Other Ambulatory Visit: Payer: Self-pay | Admitting: Registered Nurse

## 2022-07-25 DIAGNOSIS — M545 Low back pain, unspecified: Secondary | ICD-10-CM

## 2022-08-01 ENCOUNTER — Other Ambulatory Visit: Payer: Self-pay

## 2022-08-01 ENCOUNTER — Ambulatory Visit: Payer: Commercial Managed Care - HMO | Attending: Registered Nurse

## 2022-08-01 DIAGNOSIS — M545 Low back pain, unspecified: Secondary | ICD-10-CM | POA: Diagnosis present

## 2022-08-01 DIAGNOSIS — M5459 Other low back pain: Secondary | ICD-10-CM

## 2022-08-01 DIAGNOSIS — X58XXXD Exposure to other specified factors, subsequent encounter: Secondary | ICD-10-CM | POA: Insufficient documentation

## 2022-08-01 DIAGNOSIS — M6281 Muscle weakness (generalized): Secondary | ICD-10-CM
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
## 2022-08-07 ENCOUNTER — Ambulatory Visit: Payer: Commercial Managed Care - HMO | Admitting: Physical Therapy

## 2022-08-07 ENCOUNTER — Encounter: Payer: Self-pay | Admitting: Physical Therapy

## 2022-08-07 DIAGNOSIS — M545 Low back pain, unspecified: Secondary | ICD-10-CM | POA: Diagnosis not present

## 2022-08-07 DIAGNOSIS — M5459 Other low back pain: Secondary | ICD-10-CM

## 2022-08-07 DIAGNOSIS — M6281 Muscle weakness (generalized): Secondary | ICD-10-CM
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
## 2022-08-11 ENCOUNTER — Ambulatory Visit: Payer: Commercial Managed Care - HMO

## 2022-08-11 DIAGNOSIS — M6281 Muscle weakness (generalized): Secondary | ICD-10-CM

## 2022-08-11 DIAGNOSIS — M545 Low back pain, unspecified: Secondary | ICD-10-CM | POA: Diagnosis not present

## 2022-08-11 DIAGNOSIS — M5459 Other low back pain: Secondary | ICD-10-CM
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
## 2022-08-15 ENCOUNTER — Ambulatory Visit: Payer: Commercial Managed Care - HMO

## 2022-08-15 DIAGNOSIS — M5459 Other low back pain: Secondary | ICD-10-CM

## 2022-08-15 DIAGNOSIS — M545 Low back pain, unspecified: Secondary | ICD-10-CM | POA: Diagnosis not present

## 2022-08-15 DIAGNOSIS — M6281 Muscle weakness (generalized): Secondary | ICD-10-CM
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
## 2022-08-17 ENCOUNTER — Encounter: Payer: Self-pay | Admitting: Physical Therapy

## 2022-08-17 ENCOUNTER — Ambulatory Visit: Payer: Commercial Managed Care - HMO | Admitting: Physical Therapy

## 2022-08-17 DIAGNOSIS — M545 Low back pain, unspecified: Secondary | ICD-10-CM | POA: Diagnosis not present

## 2022-08-17 DIAGNOSIS — M6281 Muscle weakness (generalized): Secondary | ICD-10-CM

## 2022-08-17 DIAGNOSIS — M5459 Other low back pain: Secondary | ICD-10-CM
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
## 2022-08-22 ENCOUNTER — Ambulatory Visit: Payer: Commercial Managed Care - HMO | Attending: Registered Nurse

## 2022-08-22 DIAGNOSIS — M6281 Muscle weakness (generalized): Secondary | ICD-10-CM | POA: Diagnosis present

## 2022-08-22 DIAGNOSIS — M5459 Other low back pain: Secondary | ICD-10-CM | POA: Diagnosis present
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
## 2022-08-28 ENCOUNTER — Encounter: Payer: Self-pay | Admitting: Physical Therapy

## 2022-08-28 ENCOUNTER — Ambulatory Visit: Payer: Commercial Managed Care - HMO | Admitting: Physical Therapy

## 2022-08-28 DIAGNOSIS — M6281 Muscle weakness (generalized): Secondary | ICD-10-CM

## 2022-08-28 DIAGNOSIS — M5459 Other low back pain: Secondary | ICD-10-CM
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
## 2022-08-30 ENCOUNTER — Encounter: Payer: Self-pay | Admitting: Physical Therapy

## 2022-08-30 ENCOUNTER — Ambulatory Visit: Payer: Commercial Managed Care - HMO | Admitting: Physical Therapy

## 2022-08-30 DIAGNOSIS — M5459 Other low back pain: Secondary | ICD-10-CM | POA: Diagnosis not present

## 2022-08-30 DIAGNOSIS — M6281 Muscle weakness (generalized): Secondary | ICD-10-CM
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
## 2022-09-05 ENCOUNTER — Ambulatory Visit: Payer: Commercial Managed Care - HMO

## 2022-09-05 DIAGNOSIS — M6281 Muscle weakness (generalized): Secondary | ICD-10-CM

## 2022-09-05 DIAGNOSIS — M5459 Other low back pain: Secondary | ICD-10-CM
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
## 2022-09-08 ENCOUNTER — Ambulatory Visit: Payer: Commercial Managed Care - HMO | Admitting: Physical Therapy

## 2022-09-12 ENCOUNTER — Ambulatory Visit: Payer: Commercial Managed Care - HMO

## 2022-09-12 DIAGNOSIS — M5459 Other low back pain: Secondary | ICD-10-CM

## 2022-09-12 DIAGNOSIS — M6281 Muscle weakness (generalized): Secondary | ICD-10-CM
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
## 2022-09-14 ENCOUNTER — Ambulatory Visit: Payer: Commercial Managed Care - HMO

## 2022-11-03 ENCOUNTER — Encounter: Payer: 59 | Admitting: Registered Nurse

## 2024-04-23 ENCOUNTER — Other Ambulatory Visit: Payer: Self-pay | Admitting: Internal Medicine

## 2024-04-23 DIAGNOSIS — Z1231 Encounter for screening mammogram for malignant neoplasm of breast: Secondary | ICD-10-CM

## 2024-04-24 ENCOUNTER — Ambulatory Visit
Admission: RE | Admit: 2024-04-24 | Discharge: 2024-04-24 | Disposition: A | Discharge status: Home / Self Care | Source: Ambulatory Visit | Attending: Internal Medicine | Admitting: Internal Medicine

## 2024-04-24 DIAGNOSIS — Z1231 Encounter for screening mammogram for malignant neoplasm of breast: Secondary | ICD-10-CM

## 2024-09-17 ENCOUNTER — Telehealth: Payer: Self-pay

## 2024-09-17 NOTE — Telephone Encounter (Signed)
 Copied from CRM #[id number]. Topic: Appointments - Transfer of Care >> Sep 17, 2024  2:55 PM Timindy P wrote: Pt is requesting to transfer FROM: [facility] Dr. Kip Pt is requesting to transfer TO: [facility] [place] Dr. Job Reason for requested transfer: Pt wants to establish care with the same PCP her husband sees It is the responsibility of the team the patient would like to transfer to (Dr. Job) to reach out to the patient if for any reason this transfer is not acceptable.

## 2024-10-27 ENCOUNTER — Ambulatory Visit: Payer: Self-pay

## 2024-10-27 NOTE — Telephone Encounter (Signed)
 FYI Only or Action Required?: FYI only for provider: appointment scheduled on 10/28/2024.  Patient was last seen in primary care on 07/11/2022 by Kip Ade, NP.  Called Nurse Triage reporting Pain.  Symptoms began a week ago.  Interventions attempted: Nothing.  Symptoms are: stable.  Triage Disposition: See Physician Within 24 Hours  Patient/caregiver understands and will follow disposition?: Yes   Copied from CRM [id number]. Topic: Clinical - Red Word Triage >> Oct 27, 2024 12:13 PM Graeme ORN wrote: Red Word that prompted transfer to Nurse Triage: Chest pain right side   ----------------------------------------------------------------------- From previous Reason for Contact - Scheduling: Patient/patient representative is calling to schedule an appointment. Refer to attachments for appointment information. Reason for Disposition  [1] Chest pain lasts > 5 minutes AND [2] occurred > 3 days ago (72 hours) AND [3] NO chest pain or cardiac symptoms now  Answer Assessment - Initial Assessment Questions 1. LOCATION: Where does it hurt?       Right chest above breast 2. RADIATION: Does the pain go anywhere else? (e.g., into neck, jaw, arms, back)     na 3. ONSET: When did the chest pain begin? (Minutes, hours or days)      X week 4. PATTERN: Does the pain come and go, or has it been constant since it started?  Does it get worse with exertion?      Comes and goes 5. DURATION: How long does it last (e.g., seconds, minutes, hours)     na 6. SEVERITY: How bad is the pain?  (e.g., Scale 1-10; mild, moderate, or severe)     2/10 7. CARDIAC RISK FACTORS: Do you have any history of heart problems or risk factors for heart disease? (e.g., angina, prior heart attack; diabetes, high blood pressure, high cholesterol, smoker, or strong family history of heart disease)     HTN 8. PULMONARY RISK FACTORS: Do you have any history of lung disease?  (e.g., blood clots in lung,  asthma, emphysema, birth control pills)     no 9. CAUSE: What do you think is causing the chest pain?     unsure 10. OTHER SYMPTOMS: Do you have any other symptoms? (e.g., dizziness, nausea, vomiting, sweating, fever, difficulty breathing, cough)       no 11. PREGNANCY: Is there any chance you are pregnant? When was your last menstrual period?       Na  Pt states 2/10 right chest pain that comes and goes: none now.  Pt also states she suffers from GERD at times & none now.  Protocols used: Chest Pain-A-AH

## 2024-10-27 NOTE — Telephone Encounter (Signed)
 FYI

## 2024-10-28 ENCOUNTER — Ambulatory Visit: Admitting: Family

## 2024-10-28 ENCOUNTER — Ambulatory Visit (HOSPITAL_BASED_OUTPATIENT_CLINIC_OR_DEPARTMENT_OTHER)
Admission: RE | Admit: 2024-10-28 | Discharge: 2024-10-28 | Disposition: A | Discharge status: Home / Self Care | Source: Ambulatory Visit | Attending: Family | Admitting: Family

## 2024-10-28 ENCOUNTER — Encounter: Payer: Self-pay | Admitting: Family

## 2024-10-28 ENCOUNTER — Ambulatory Visit: Payer: Self-pay | Admitting: Family

## 2024-10-28 VITALS — BP 124/86 | HR 64 | Temp 97.4°F | Ht 65.0 in | Wt 271.4 lb

## 2024-10-28 DIAGNOSIS — R079 Chest pain, unspecified: Secondary | ICD-10-CM

## 2024-10-28 LAB — COMPREHENSIVE METABOLIC PANEL WITH GFR
ALT: 15 U/L (ref 0–35)
AST: 18 U/L (ref 0–37)
Albumin: 4.2 g/dL (ref 3.5–5.2)
Alkaline Phosphatase: 88 U/L (ref 39–117)
BUN: 12 mg/dL (ref 6–23)
CO2: 28 meq/L (ref 19–32)
Calcium: 9.4 mg/dL (ref 8.4–10.5)
Chloride: 103 meq/L (ref 96–112)
Creatinine, Ser: 0.87 mg/dL (ref 0.40–1.20)
GFR: 76.41 mL/min (ref >60.00)
Glucose, Bld: 81 mg/dL (ref 70–99)
Potassium: 4.7 meq/L (ref 3.5–5.1)
Sodium: 140 meq/L (ref 135–145)
Total Bilirubin: 0.5 mg/dL (ref 0.2–1.2)
Total Protein: 8.4 g/dL — ABNORMAL HIGH (ref 6.0–8.3)

## 2024-10-28 LAB — CBC WITH DIFFERENTIAL/PLATELET
Basophils Absolute: 0.1 K/uL (ref 0.0–0.1)
Basophils Relative: 1.1 % (ref 0.0–3.0)
Eosinophils Absolute: 0.2 K/uL (ref 0.0–0.7)
Eosinophils Relative: 3.5 % (ref 0.0–5.0)
HCT: 41 % (ref 36.0–46.0)
Hemoglobin: 13.7 g/dL (ref 12.0–15.0)
Lymphocytes Relative: 29.2 % (ref 12.0–46.0)
Lymphs Abs: 1.6 K/uL (ref 0.7–4.0)
MCHC: 33.4 g/dL (ref 30.0–36.0)
MCV: 86.9 fl (ref 78.0–100.0)
Monocytes Absolute: 0.5 K/uL (ref 0.1–1.0)
Monocytes Relative: 8.6 % (ref 3.0–12.0)
Neutro Abs: 3.2 K/uL (ref 1.4–7.7)
Neutrophils Relative %: 57.6 % (ref 43.0–77.0)
Platelets: 290 K/uL (ref 150.0–400.0)
RBC: 4.71 Mil/uL (ref 3.87–5.11)
RDW: 15.1 % (ref 11.5–15.5)
WBC: 5.6 K/uL (ref 4.0–10.5)

## 2024-10-28 NOTE — Progress Notes (Signed)
 Acute Office Visit  Subjective:     Patient ID: Leslie Duke, female    DOB: Oct 25, 1971, 53 y.o.   MRN: [id number]  Chief Complaint  Patient presents with   Chest Pain    R side above breast; started last week; sharp pain that comes and goes; feels like muscles contracting maybe, hard to explain; no pain right now    HPI Patient is in today with complaints of right chest wall pain that she feels above her right breast radiating underneath the right breast that started 1 week ago suddenly.  She rates the pain to a 2-3 out of 10 and describes it as sharp and intermittent.  Has not taken any medication to relieve her symptoms.  Denies any sneezing, cough, congestion, shortness of breath, no heavy lifting, no injury.  Review of Systems  Constitutional: Negative.   Respiratory: Negative.    Cardiovascular:  Negative for palpitations and leg swelling.       Right chest wall pain  Gastrointestinal: Negative.  Negative for nausea and vomiting.  Musculoskeletal: Negative.   Skin: Negative.   Neurological: Negative.   Endo/Heme/Allergies: Negative.   Psychiatric/Behavioral: Negative.     History reviewed. No pertinent past medical history.  Social History   Socioeconomic History   Marital status: Married    Spouse name: Not on file   Number of children: 3   Years of education: Not on file   Highest education level: Not on file  Occupational History   Not on file  Tobacco Use   Smoking status: Never   Smokeless tobacco: Never  Vaping Use   Vaping status: Never Used  Substance and Sexual Activity   Alcohol use: Not Currently   Drug use: Never   Sexual activity: Not Currently  Other Topics Concern   Not on file  Social History Narrative   Not on file   Social Drivers of Health   Financial Resource Strain: Not on file  Food Insecurity: Not on file  Transportation Needs: Not on file  Physical Activity: Not on file  Stress: Not on file  Social Connections: Not on file   Intimate Partner Violence: Not on file    History reviewed. No pertinent surgical history.  Family History  Problem Relation Age of Onset   Diabetes Mother    Hypertension Mother    Congestive Heart Failure Mother    Breast cancer Sister 77 - 34   Healthy Daughter    Healthy Son    BRCA 1/2 Neg Hx     No Known Allergies  Current Outpatient Medications on File Prior to Visit  Medication Sig Dispense Refill   amLODipine  (NORVASC ) 5 MG tablet Take 1 tablet (5 mg total) by mouth daily. 30 tablet 2   Blood Pressure Monitoring (BLOOD PRESSURE CUFF) MISC Check BP 1-2 times daily and write it down. 1 each 0   cyclobenzaprine  (FLEXERIL ) 10 MG tablet Take 1 tablet (10 mg total) by mouth 3 (three) times daily as needed for muscle spasms. (Patient not taking: Reported on 10/28/2024) 30 tablet 0   diclofenac  (VOLTAREN ) 75 MG EC tablet TAKE 1 TABLET(75 MG) BY MOUTH TWICE DAILY (Patient not taking: Reported on 10/28/2024) 30 tablet 0   pantoprazole  (PROTONIX ) 40 MG tablet Take 1 tablet (40 mg total) by mouth daily. (Patient not taking: Reported on 10/28/2024) 30 tablet 3   Vitamin D , Ergocalciferol , (DRISDOL ) 1.25 MG (50000 UNIT) CAPS capsule Take 1 capsule (50,000 Units total) by mouth every 7 (seven)  days. (Patient not taking: Reported on 10/28/2024) 12 capsule 0   No current facility-administered medications on file prior to visit.    BP 124/86   Pulse 64   Temp (!) 97.4 F (36.3 C)   Ht 5' 5 (1.651 m)   Wt 271 lb 6.4 oz (123.1 kg)   LMP 10/01/2020   SpO2 99%   BMI 45.16 kg/m chart      Objective:    BP 124/86   Pulse 64   Temp (!) 97.4 F (36.3 C)   Ht 5' 5 (1.651 m)   Wt 271 lb 6.4 oz (123.1 kg)   LMP 10/01/2020   SpO2 99%   BMI 45.16 kg/m    Physical Exam Vitals and nursing note reviewed.  Constitutional:      Appearance: She is well-developed and normal weight.  Cardiovascular:     Rate and Rhythm: Regular rhythm. Tachycardia present.     Heart sounds: Normal  heart sounds.  Pulmonary:     Effort: Pulmonary effort is normal.  Chest:     Chest wall: No mass, deformity or tenderness.  Abdominal:     General: Bowel sounds are normal.     Palpations: Abdomen is soft.  Musculoskeletal:        General: Normal range of motion.     Cervical back: Normal range of motion and neck supple.  Skin:    General: Skin is warm.  Neurological:     General: No focal deficit present.     Mental Status: She is alert and oriented to person, place, and time.  Psychiatric:        Mood and Affect: Mood normal.        Behavior: Behavior normal.     No results found for any visits on 10/28/24.      Assessment & Plan:   Problem List Items Addressed This Visit   None Visit Diagnoses       Right-sided chest pain    -  Primary   Relevant Orders   DG Chest 2 View   CMP   CBC w/Diff       Atypical chest wall pain.  Will order an x-ray and labs.  Will notify patient pending results and discuss further treatment plan thereafter.   No follow-ups on file.  Lakeith Careaga B Shaughn Thomley, FNP

## 2024-11-04 NOTE — Telephone Encounter (Signed)
 Copied from CRM [id number]. Topic: Clinical - Lab/Test Results >> Nov 04, 2024 10:00 AM Revonda D wrote: Reason for CRM: Pt was made aware of recent lab results.

## 2024-11-04 NOTE — Progress Notes (Signed)
 Called patient to discuss xray results. Left vm to return call . If patient calls back, please relay results

## 2024-11-06 ENCOUNTER — Encounter: Payer: Self-pay | Admitting: Student in an Organized Health Care Education/Training Program

## 2024-11-06 ENCOUNTER — Ambulatory Visit (INDEPENDENT_AMBULATORY_CARE_PROVIDER_SITE_OTHER): Admitting: Student in an Organized Health Care Education/Training Program

## 2024-11-06 VITALS — BP 113/91 | HR 77 | Wt 272.0 lb

## 2024-11-06 DIAGNOSIS — Z0000 Encounter for general adult medical examination without abnormal findings: Secondary | ICD-10-CM | POA: Insufficient documentation

## 2024-11-06 DIAGNOSIS — E66813 Obesity, class 3: Secondary | ICD-10-CM | POA: Insufficient documentation

## 2024-11-06 DIAGNOSIS — Z131 Encounter for screening for diabetes mellitus: Secondary | ICD-10-CM | POA: Diagnosis not present

## 2024-11-06 DIAGNOSIS — E049 Nontoxic goiter, unspecified: Secondary | ICD-10-CM | POA: Diagnosis not present

## 2024-11-06 DIAGNOSIS — I10 Essential (primary) hypertension: Secondary | ICD-10-CM | POA: Insufficient documentation

## 2024-11-06 DIAGNOSIS — Z13220 Encounter for screening for lipoid disorders: Secondary | ICD-10-CM

## 2024-11-06 DIAGNOSIS — Z1211 Encounter for screening for malignant neoplasm of colon: Secondary | ICD-10-CM

## 2024-11-06 NOTE — Assessment & Plan Note (Signed)
 Her hypertension is well-controlled with amlodipine , aided by lifestyle modifications. She should continue taking amlodipine  and maintain lifestyle changes, including walking.

## 2024-11-06 NOTE — Progress Notes (Signed)
 Complete physical exam  Patient: Leslie Duke    DOB: [birth year] 53 y.o.   MRN: [id number]  Chief Complaint  Patient presents with   Establish Care    Subjective:    Leslie Duke is a 53 y.o. female who presents today for a complete physical exam. She reports consuming a general diet. The patient does not participate in regular exercise at present. She generally feels well. She reports sleeping well. She does have additional problems to discuss today.   Discussed the use of AI scribe software for clinical note transcription with the patient, who gave verbal consent to proceed.  History of Present Illness Leslie Duke is a 53 year old female with hypertension who presents for follow-up after experiencing chest pain.  She experienced chest pain last week during movement. She consulted a physician, and all lab results were normal. The pain has since resolved.  She is currently taking amlodipine  for hypertension. She attributes this improvement to increased physical activity, specifically walking.  She has a history of a heart murmur and underwent surgery as a baby due to pneumonia, though details are unclear as her mother, who used to discuss it, passed away in 17-Dec-2018.  Her weight has been stable at 272 pounds over the past two years. She wants to lose weight but has not engaged in calorie counting or dieting recently. She recalls losing weight in the past with a program called [employer] before marriage and children.  Her sleep is generally adequate, though she wakes up to use the bathroom. Her children have mentioned that she snores, but she does not wake up due to breathing issues. She feels well-rested most days.  She has completed her mammogram and Pap smear screenings this year and last year, respectively, and underwent a Cologuard test in January 2023, which was negative.   Most recent fall risk assessment:    07/11/2022    8:59 AM  Fall Risk   Falls in the past  year? 0  Number falls in past yr: 0  Injury with Fall? 0  Risk for fall due to : No Fall Risks  Follow up Falls evaluation completed      Data saved with a previous flowsheet row definition     Most recent depression screenings:    10/28/2024    9:05 AM 07/11/2022    8:59 AM  PHQ 2/9 Scores  PHQ - 2 Score 0 0  PHQ- 9 Score 0 1      Data saved with a previous flowsheet row definition    Patient Care Team: Jerrell Cleatus Ned, MD as PCP - General (Internal Medicine)      Objective:    BP (!) 113/91   Pulse 77   Wt 272 lb (123.4 kg)   LMP 10/01/2020   BMI 45.26 kg/m   Physical Exam   Gen: Well-appearing woman Neck: Moderate thyromegaly noted, increased fullness of the left side could be consistent with a thyroid  nodule, dorsocervical fat pad hypertrophy is present Heart: Regular, 2 out of 6 early systolic murmur best heard at the right upper sternal border Lungs: Unlabored, clear throughout Ext: Warm, trace nonpitting edema present in both lower extremities     Assessment & Plan:    Routine Health Maintenance and Physical Exam Immunization History  Administered Date(s) Administered   Tdap 11/01/2021    Health Maintenance  Topic Date Due   Cervical Cancer Screening (HPV/Pap Cotest)  Never done   COVID-19 Vaccine (1 - 2025-26  season) 11/13/2024 (Originally 08/18/2024)   Zoster Vaccines- Shingrix (1 of 2) 01/28/2025 (Originally 01/16/2022)   Influenza Vaccine  03/17/2025 (Originally 07/18/2024)   Pneumococcal Vaccine: 50+ Years (1 of 1 - PCV) 10/28/2025 (Originally 01/16/2022)   Hepatitis B Vaccines 19-59 Average Risk (1 of 3 - 19+ 3-dose series) 10/28/2025 (Originally 01/16/1991)   Fecal DNA (Cologuard)  01/03/2025   Mammogram  04/24/2026   DTaP/Tdap/Td (2 - Td or Tdap) 11/02/2031   Hepatitis C Screening  Completed   HIV Screening  Completed   HPV VACCINES  Aged Out   Meningococcal B Vaccine  Aged Out    Discussed health benefits of physical activity, and  encouraged her to engage in regular exercise appropriate for her age and condition.  In addition to the complete physical examination, a separate encounter was completed today for evaluation of thyromegaly.  Problem List Items Addressed This Visit       High   Hypertension (Chronic)   Her hypertension is well-controlled with amlodipine , aided by lifestyle modifications. She should continue taking amlodipine  and maintain lifestyle changes, including walking.      Obesity, Class III, BMI 40-49.9  (HCC) (Chronic)   Her weight is stable at 272 lbs. The risks of obesity, including diabetes, back pain, and sleep apnea, were discussed. Dietary changes focusing on fresh vegetables, fruits, whole grains, and lean meats are encouraged. The potential for medication-assisted weight loss and insurance coverage variability was also discussed.        Medium    Enlarged thyroid (Chronic)   She has a large thyroid with a suspected multinodular goiter but no symptoms of thyroid dysfunction. The need for further evaluation to rule out thyroid cancer were discussed. A thyroid ultrasound and blood work to assess thyroid function were ordered.      Relevant Orders   TSH   US  THYROID     Low   Health maintenance examination - Primary (Chronic)   During her routine wellness visit, her weight is stable and blood pressure is well-controlled. She is up to date on cervical and breast cancer screenings. Potential sleep apnea due to weight and snoring was discussed. A Cologuard kit was ordered for colon cancer screening. She is encouraged to adopt healthy lifestyle changes, including diet and exercise, and the potential for medication-assisted weight loss in the future was discussed.      Other Visit Diagnoses       Screening for lipid disorders       Relevant Orders   Lipid panel     Screening for diabetes mellitus       Relevant Orders   Hemoglobin A1c     Screening for colon cancer       Relevant  Orders   Cologuard       Return in about 6 months (around 05/06/2025).    Cleatus Leslie Specking, MD [facility]

## 2024-11-06 NOTE — Patient Instructions (Signed)
  VISIT SUMMARY: During your visit today, we discussed your recent chest pain, which has since resolved, and reviewed your overall health. Your blood pressure is well-controlled, and you are up to date on your cancer screenings. We also talked about your weight, potential sleep apnea, and your known heart murmur.  YOUR PLAN: -ADULT WELLNESS VISIT: Your weight is stable, and your blood pressure is well-controlled. You are up to date on your cervical and breast cancer screenings. We discussed the possibility of sleep apnea due to your weight and snoring. A Cologuard kit was ordered for colon cancer screening. You are encouraged to adopt healthy lifestyle changes, including diet and exercise, and we may consider medication-assisted weight loss in the future.  -ESSENTIAL HYPERTENSION: Your high blood pressure is well-controlled with your current medication, amlodipine , and your lifestyle changes, including walking. Continue taking amlodipine  and maintain your healthy habits.  -MULTINODULAR GOITER: You have a large thyroid with multiple nodules but no symptoms of thyroid dysfunction. We discussed the genetic component and the need for further evaluation to rule out thyroid cancer. A thyroid ultrasound and blood work to assess thyroid function were ordered.  -OBESITY: Your weight is stable at 272 lbs. We discussed the risks associated with obesity, such as diabetes, back pain, and sleep apnea. You are encouraged to make dietary changes focusing on fresh vegetables, fruits, whole grains, and lean meats. We also discussed the potential for medication-assisted weight loss and the variability of insurance coverage for such treatments.  -HEART MURMUR: You have a known heart murmur with no new symptoms. We will continue to monitor this condition.  INSTRUCTIONS: Please follow up with the thyroid ultrasound and blood work as ordered. Continue taking your amlodipine  as prescribed and maintain your healthy lifestyle  changes. Use the Cologuard kit for colon cancer screening as instructed. Consider making dietary changes and increasing physical activity to aid in weight loss. We will monitor your heart murmur and discuss any new symptoms if they arise.

## 2024-11-06 NOTE — Assessment & Plan Note (Signed)
 Her weight is stable at 272 lbs. The risks of obesity, including diabetes, back pain, and sleep apnea, were discussed. Dietary changes focusing on fresh vegetables, fruits, whole grains, and lean meats are encouraged. The potential for medication-assisted weight loss and insurance coverage variability was also discussed.

## 2024-11-06 NOTE — Assessment & Plan Note (Signed)
 During her routine wellness visit, her weight is stable and blood pressure is well-controlled. She is up to date on cervical and breast cancer screenings. Potential sleep apnea due to weight and snoring was discussed. A Cologuard kit was ordered for colon cancer screening. She is encouraged to adopt healthy lifestyle changes, including diet and exercise, and the potential for medication-assisted weight loss in the future was discussed.

## 2024-11-06 NOTE — Assessment & Plan Note (Signed)
 She has a large thyroid with a suspected multinodular goiter but no symptoms of thyroid dysfunction. The need for further evaluation to rule out thyroid cancer were discussed. A thyroid ultrasound and blood work to assess thyroid function were ordered.

## 2024-11-07 LAB — LIPID PANEL
Cholesterol: 163 mg/dL (ref 0–200)
HDL: 49.5 mg/dL (ref >39.00)
LDL Cholesterol: 101 mg/dL — ABNORMAL HIGH (ref 0–99)
NonHDL: 113.31
Total CHOL/HDL Ratio: 3
Triglycerides: 63 mg/dL (ref 0.0–149.0)
VLDL: 12.6 mg/dL (ref 0.0–40.0)

## 2024-11-07 LAB — HEMOGLOBIN A1C: Hgb A1c MFr Bld: 5.6 % (ref 4.6–6.5)

## 2024-11-07 LAB — TSH: TSH: 1.85 u[IU]/mL (ref 0.35–5.50)

## 2024-11-10 ENCOUNTER — Ambulatory Visit: Payer: Self-pay | Admitting: Student in an Organized Health Care Education/Training Program

## 2025-04-08 ENCOUNTER — Encounter: Admitting: Physician Assistant

## 2025-05-06 ENCOUNTER — Ambulatory Visit: Admitting: Family Medicine
